# Patient Record
Sex: Female | Born: 1975 | Race: Black or African American | Hispanic: No | Marital: Married | Smoking: Never smoker
Health system: Southern US, Community
[De-identification: ages and names within clinical notes are randomized; demographics above are authoritative.]

---

## 1999-11-25 ENCOUNTER — Other Ambulatory Visit: Admission: RE | Admit: 1999-11-25 | Discharge: 1999-11-25 | Payer: Self-pay | Admitting: Obstetrics and Gynecology

## 2002-06-18 ENCOUNTER — Other Ambulatory Visit: Admission: RE | Admit: 2002-06-18 | Discharge: 2002-06-18 | Payer: Self-pay | Admitting: Obstetrics and Gynecology

## 2003-08-06 ENCOUNTER — Other Ambulatory Visit: Admission: RE | Admit: 2003-08-06 | Discharge: 2003-08-06 | Payer: Self-pay | Admitting: Obstetrics and Gynecology

## 2004-04-28 ENCOUNTER — Emergency Department (HOSPITAL_COMMUNITY): Admission: EM | Admit: 2004-04-28 | Discharge: 2004-04-28 | Payer: Self-pay | Admitting: Emergency Medicine

## 2004-09-11 ENCOUNTER — Inpatient Hospital Stay (HOSPITAL_COMMUNITY): Admission: AD | Admit: 2004-09-11 | Discharge: 2004-09-13 | Payer: Self-pay | Admitting: Obstetrics and Gynecology

## 2005-01-14 ENCOUNTER — Emergency Department (HOSPITAL_COMMUNITY): Admission: EM | Admit: 2005-01-14 | Discharge: 2005-01-14 | Payer: Self-pay | Admitting: *Deleted

## 2006-06-19 ENCOUNTER — Ambulatory Visit (HOSPITAL_COMMUNITY): Admission: RE | Admit: 2006-06-19 | Discharge: 2006-06-19 | Payer: Self-pay | Admitting: Obstetrics and Gynecology

## 2006-07-11 ENCOUNTER — Encounter: Payer: Self-pay | Admitting: Obstetrics and Gynecology

## 2006-07-11 ENCOUNTER — Inpatient Hospital Stay (HOSPITAL_COMMUNITY): Admission: AD | Admit: 2006-07-11 | Discharge: 2006-07-20 | Payer: Self-pay | Admitting: Obstetrics and Gynecology

## 2006-07-13 ENCOUNTER — Encounter: Payer: Self-pay | Admitting: Obstetrics and Gynecology

## 2006-07-16 ENCOUNTER — Encounter: Payer: Self-pay | Admitting: Obstetrics and Gynecology

## 2006-07-17 ENCOUNTER — Ambulatory Visit: Payer: Self-pay | Admitting: Pediatrics

## 2006-07-18 ENCOUNTER — Encounter: Payer: Self-pay | Admitting: Obstetrics and Gynecology

## 2006-07-20 ENCOUNTER — Encounter: Payer: Self-pay | Admitting: Obstetrics and Gynecology

## 2006-07-23 ENCOUNTER — Ambulatory Visit (HOSPITAL_COMMUNITY): Admission: RE | Admit: 2006-07-23 | Discharge: 2006-07-23 | Payer: Self-pay | Admitting: Obstetrics and Gynecology

## 2006-07-26 ENCOUNTER — Ambulatory Visit (HOSPITAL_COMMUNITY): Admission: RE | Admit: 2006-07-26 | Discharge: 2006-07-26 | Payer: Self-pay | Admitting: Obstetrics and Gynecology

## 2006-08-02 ENCOUNTER — Ambulatory Visit (HOSPITAL_COMMUNITY): Admission: RE | Admit: 2006-08-02 | Discharge: 2006-08-02 | Payer: Self-pay | Admitting: Obstetrics and Gynecology

## 2006-08-09 ENCOUNTER — Ambulatory Visit (HOSPITAL_COMMUNITY): Admission: RE | Admit: 2006-08-09 | Discharge: 2006-08-09 | Payer: Self-pay | Admitting: Obstetrics and Gynecology

## 2006-08-23 ENCOUNTER — Ambulatory Visit (HOSPITAL_COMMUNITY): Admission: RE | Admit: 2006-08-23 | Discharge: 2006-08-23 | Payer: Self-pay | Admitting: Obstetrics and Gynecology

## 2006-09-14 ENCOUNTER — Ambulatory Visit (HOSPITAL_COMMUNITY): Admission: RE | Admit: 2006-09-14 | Discharge: 2006-09-14 | Payer: Self-pay | Admitting: Obstetrics and Gynecology

## 2006-10-05 ENCOUNTER — Inpatient Hospital Stay (HOSPITAL_COMMUNITY): Admission: AD | Admit: 2006-10-05 | Discharge: 2006-10-07 | Payer: Self-pay | Admitting: Obstetrics and Gynecology

## 2007-09-17 IMAGING — US US OB FOLLOW-UP EACH ADDL GEST (MODIFY)
1 series · 14 of 28 positions shown · non-contrast
Comparison: none

OBSTETRICAL ULTRASOUND:
 This ultrasound was performed in The [HOSPITAL], and the [facility] report will be stored to [REDACTED] PACS.

[Series 1: us ob follow-up each addl gest (modify) · 14 of 75 slices shown]
[im 3/75]
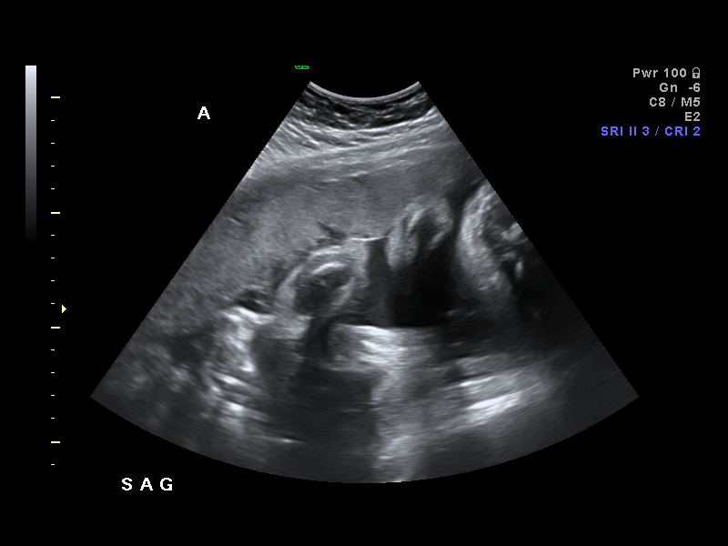
[im 9/75]
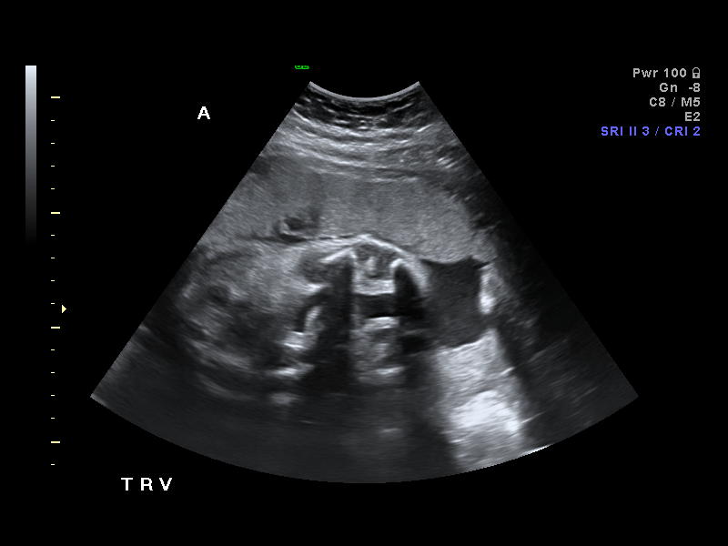
[im 14/75]
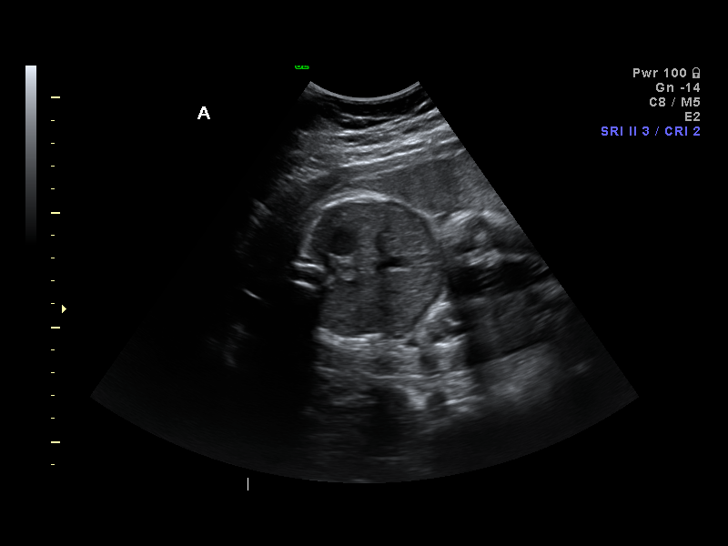
[im 20/75]
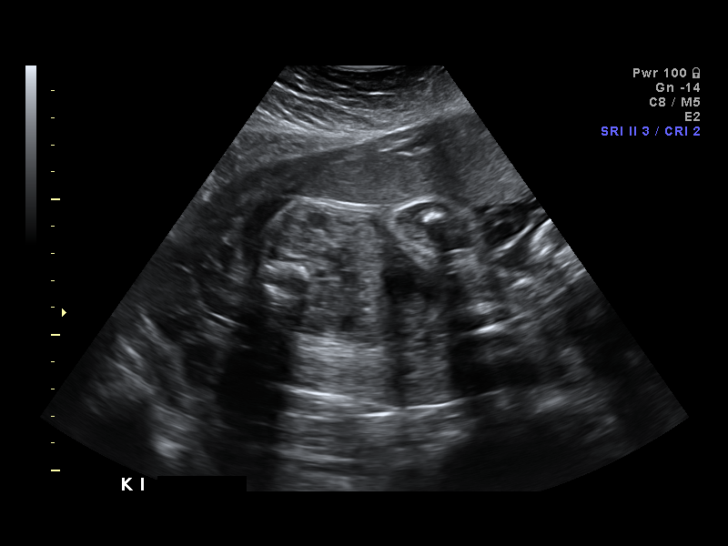
[im 25/75]
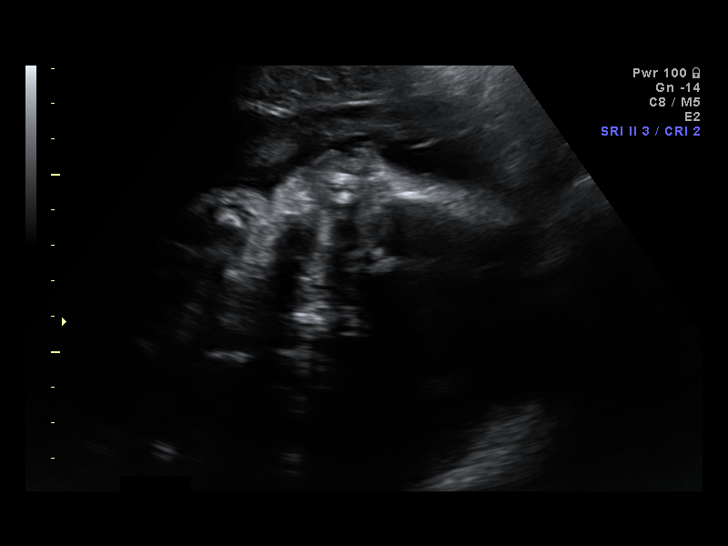
[im 31/75]
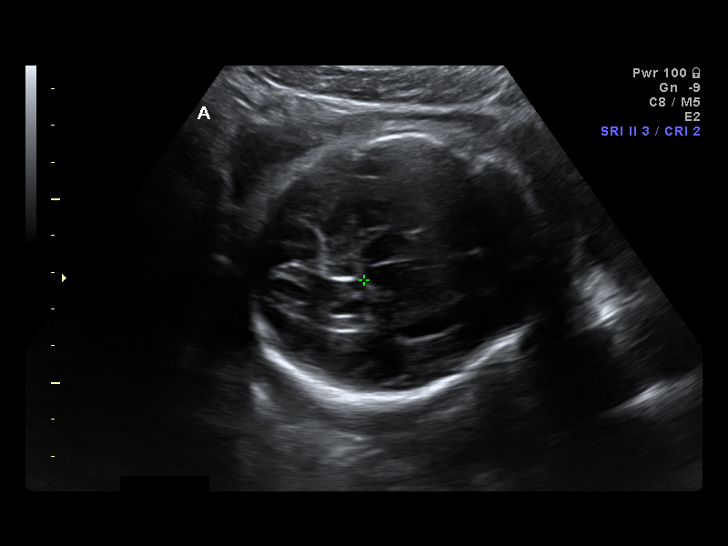
[im 36/75]
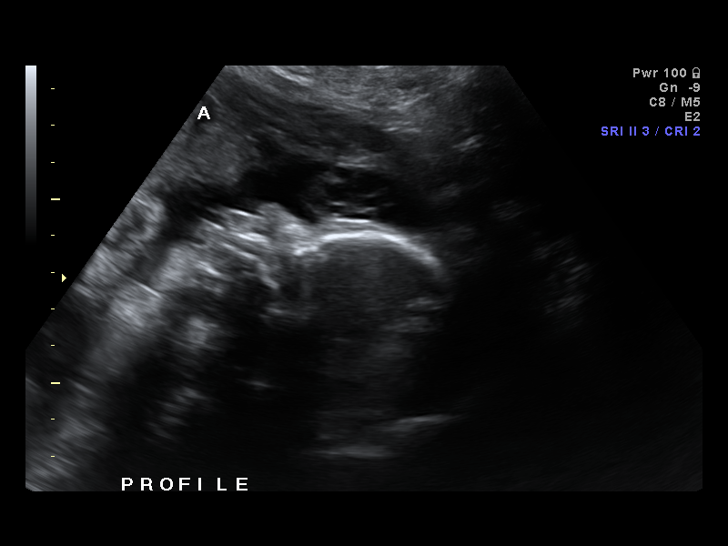
[im 42/75]
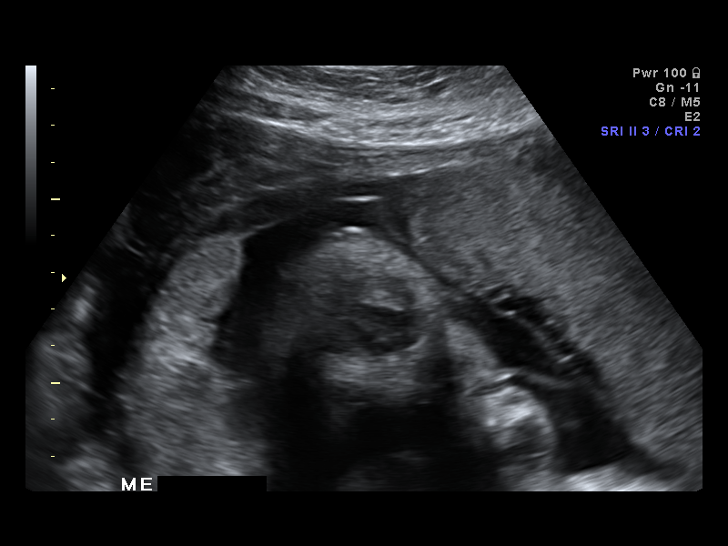
[im 47/75]
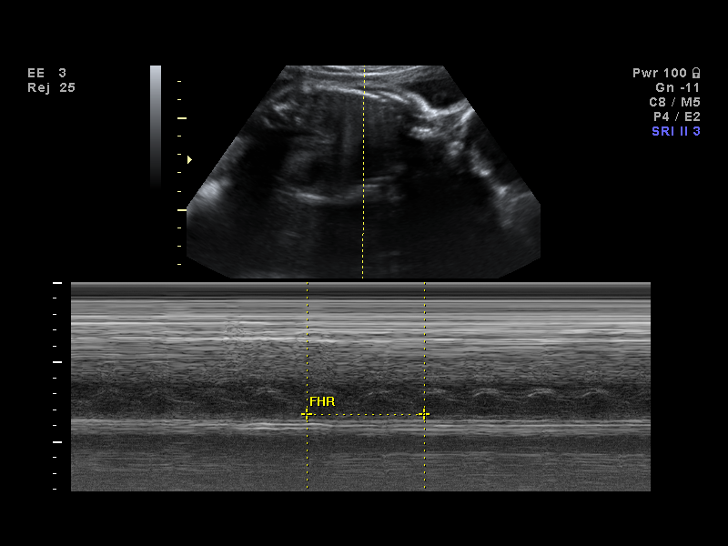
[im 53/75]
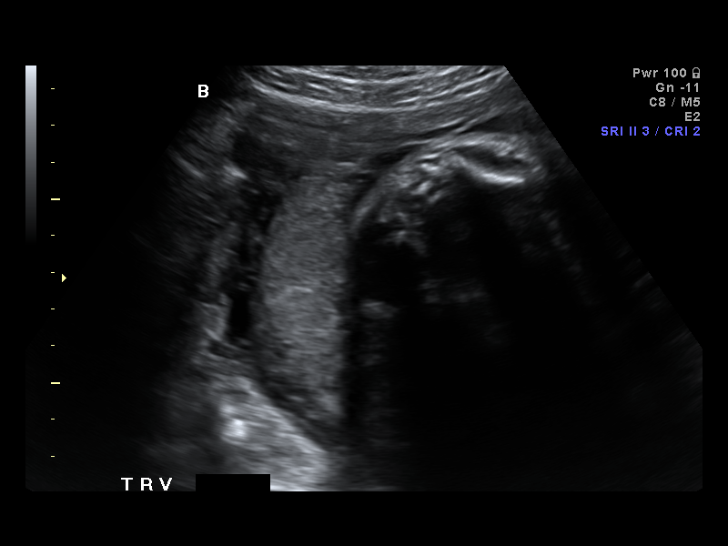
[im 58/75]
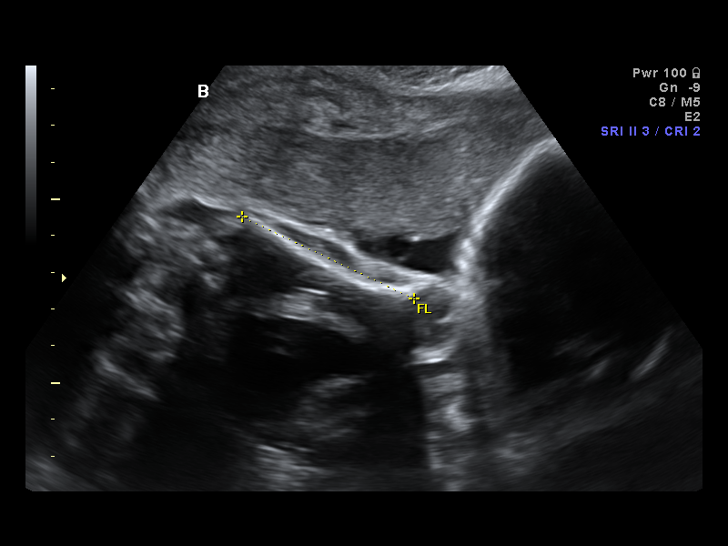
[im 64/75]
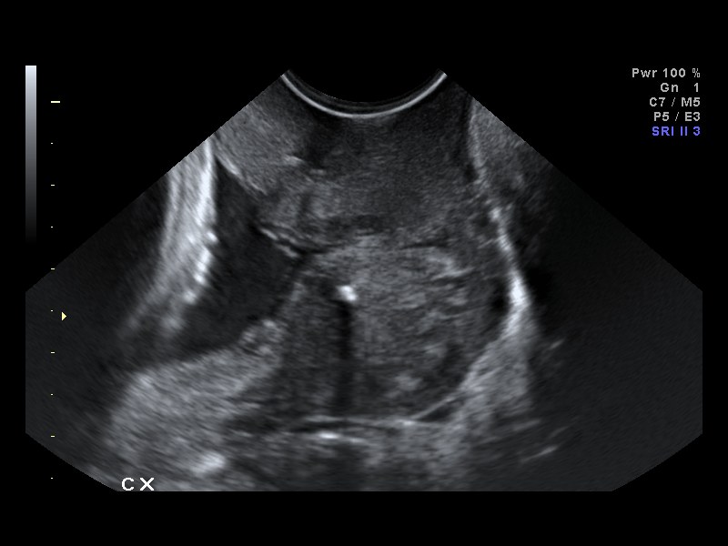
[im 69/75]
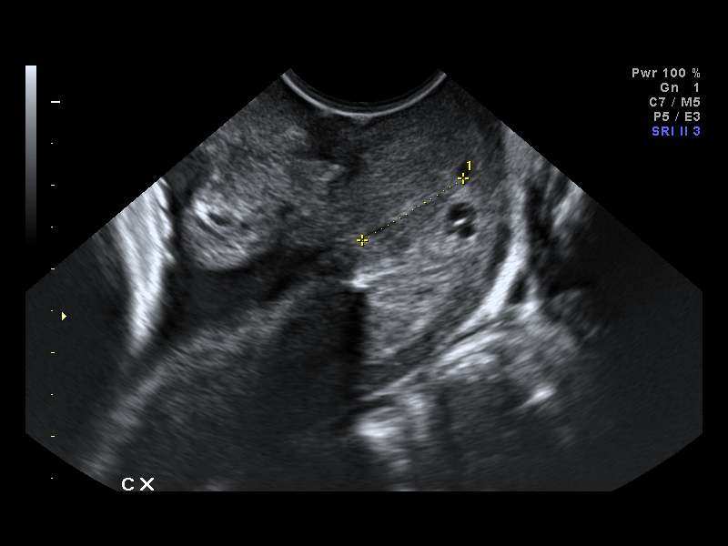
[im 75/75]
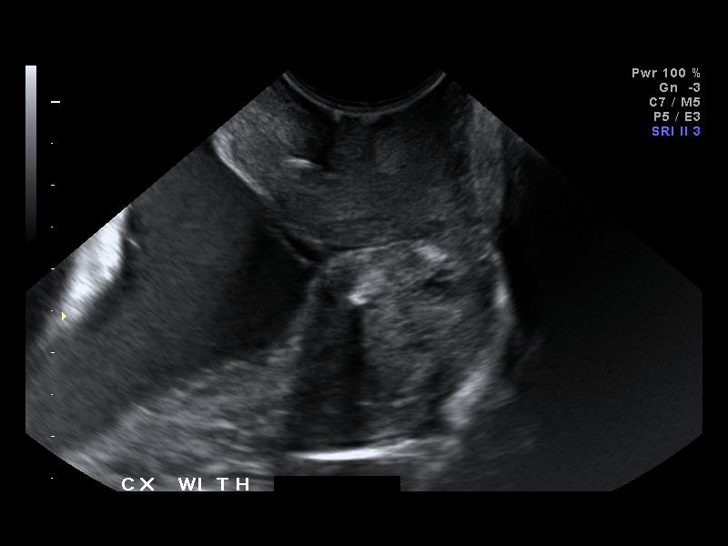

[14 of 28 positions shown; findings below may reference images not displayed]

IMPRESSION: The [facility] report has also been faxed to the ordering physician.

## 2010-08-28 ENCOUNTER — Encounter: Payer: Self-pay | Admitting: Obstetrics and Gynecology

## 2016-07-04 ENCOUNTER — Ambulatory Visit (INDEPENDENT_AMBULATORY_CARE_PROVIDER_SITE_OTHER): Payer: Managed Care, Other (non HMO) | Admitting: Physician Assistant

## 2016-07-04 ENCOUNTER — Ambulatory Visit (INDEPENDENT_AMBULATORY_CARE_PROVIDER_SITE_OTHER): Payer: Managed Care, Other (non HMO)

## 2016-07-04 VITALS — BP 140/80 | HR 70 | Temp 98.4°F | Resp 16 | Ht 67.25 in | Wt 210.0 lb

## 2016-07-04 DIAGNOSIS — M79672 Pain in left foot: Secondary | ICD-10-CM | POA: Diagnosis not present

## 2016-07-04 LAB — URIC ACID: Uric Acid, Serum: 3.1 mg/dL (ref 2.5–7.0)

## 2016-07-04 NOTE — Progress Notes (Signed)
   07/04/2016 11:56 AM   DOB: 04/19/1976 / MRN: [id number]  SUBJECTIVE:  Wanda Hill is a 40 y.o. female presenting for left foot pain about the MTP that started about 4 weeks ago after a fall down some stairs.  She reports she has largely recovered however the will still be some pain with ambulation.  She wants to rule out fracture with an xray.  She has not tried any medication yet. She has no history of gout.   She has No Known Allergies.   She  has no past medical history on file.    She  reports that she has never smoked. She has never used smokeless tobacco. She reports that she does not drink alcohol or use drugs. She  has no sexual activity history on file. The patient  has no past surgical history on file.  Her family history includes Cancer in her paternal grandmother; Gout in her maternal grandmother; Hypertension in her maternal grandmother and mother.  Review of Systems  Constitutional: Negative for chills and fever.  Eyes: Negative for blurred vision.  Respiratory: Negative for cough and shortness of breath.   Cardiovascular: Negative for chest pain.  Gastrointestinal: Negative for abdominal pain and nausea.  Genitourinary: Negative for dysuria, frequency and urgency.  Musculoskeletal: Positive for joint pain. Negative for myalgias.  Skin: Negative for rash.  Neurological: Negative for dizziness, tingling and headaches.  Psychiatric/Behavioral: Negative for depression. The patient is not nervous/anxious.     The problem list and medications were reviewed and updated by myself where necessary and exist elsewhere in the encounter.   OBJECTIVE:  BP 140/80 (BP Location: Right Arm, Patient Position: Sitting, Cuff Size: Normal)   Pulse 70   Temp 98.4 F (36.9 C) (Oral)   Resp 16   Ht 5' 7.25" (1.708 m)   Wt 210 lb (95.3 kg)   LMP 06/02/2016   SpO2 100%   BMI 32.65 kg/m   Physical Exam  Constitutional: She is oriented to person, place, and time. She appears  well-nourished. No distress.  Eyes: EOM are normal. Pupils are equal, round, and reactive to light.  Cardiovascular: Normal rate.   Pulmonary/Chest: Effort normal.  Abdominal: She exhibits no distension.  Musculoskeletal: She exhibits tenderness (left first mtp, negative for swelling, bruising, deformity. ).  Neurological: She is alert and oriented to person, place, and time. No cranial nerve deficit. Gait normal.  Skin: Skin is warm and dry. She is not diaphoretic.  Psychiatric: She has a normal mood and affect.  Vitals reviewed.   No results found for this or any previous visit (from the past 72 hour(s)).  No results found.  ASSESSMENT AND PLAN  [name] was seen today for fall and foot injury.  Diagnoses and all orders for this visit:  Left foot pain: Rads negative.  Placed in post op shoe.  Advised tylenol for now.  If no improvement in one month with refer to podiatry.  -     Uric Acid -     DG Foot 2 Views Left; Future    The patient is advised to call or return to clinic if she does not see an improvement in symptoms, or to seek the care of the closest emergency department if she worsens with the above plan.   Deliah BostonMichael Ilyas Lipsitz, MHS, PA-C [facility] 07/04/2016 11:56 AM

## 2016-07-04 NOTE — Patient Instructions (Addendum)
Tylenol at 1000 mg every eight hours as needed for pain is a safe and effective medication for pain like yours.  It is safe for your stomach, liver and kidneys and does not cause GI bleeding or high blood pressure.     IF you received an x-ray today, you will receive an invoice from [facility]. Please contact [facility] at [phone] with questions or concerns regarding your invoice.   IF you received labwork today, you will receive an invoice from [facility]. Please contact [facility] at [phone] with questions or concerns regarding your invoice.   Our billing staff will not be able to assist you with questions regarding bills from these companies.  You will be contacted with the lab results as soon as they are available. The fastest way to get your results is to activate your My Chart account. Instructions are located on the last page of this paperwork. If you have not heard from us regarding the results in 2 weeks, please contact this office.

## 2016-07-06 ENCOUNTER — Encounter: Payer: Self-pay | Admitting: Physician Assistant

## 2017-01-31 ENCOUNTER — Other Ambulatory Visit: Payer: Self-pay | Admitting: Obstetrics and Gynecology

## 2017-01-31 DIAGNOSIS — N6320 Unspecified lump in the left breast, unspecified quadrant: Secondary | ICD-10-CM

## 2017-02-02 ENCOUNTER — Ambulatory Visit
Admission: RE | Admit: 2017-02-02 | Discharge: 2017-02-02 | Disposition: A | Discharge status: Home / Self Care | Payer: Managed Care, Other (non HMO) | Source: Ambulatory Visit | Attending: Obstetrics and Gynecology | Admitting: Obstetrics and Gynecology

## 2017-02-02 ENCOUNTER — Other Ambulatory Visit: Payer: Self-pay | Admitting: Obstetrics and Gynecology

## 2017-02-02 DIAGNOSIS — N6320 Unspecified lump in the left breast, unspecified quadrant: Secondary | ICD-10-CM

## 2017-08-13 ENCOUNTER — Other Ambulatory Visit: Payer: Self-pay | Admitting: Obstetrics and Gynecology

## 2017-08-13 ENCOUNTER — Ambulatory Visit
Admission: RE | Admit: 2017-08-13 | Discharge: 2017-08-13 | Disposition: A | Discharge status: Home / Self Care | Payer: Managed Care, Other (non HMO) | Source: Ambulatory Visit | Attending: Obstetrics and Gynecology | Admitting: Obstetrics and Gynecology

## 2017-08-13 DIAGNOSIS — N6320 Unspecified lump in the left breast, unspecified quadrant: Secondary | ICD-10-CM

## 2018-02-14 ENCOUNTER — Ambulatory Visit
Admission: RE | Admit: 2018-02-14 | Discharge: 2018-02-14 | Disposition: A | Discharge status: Home / Self Care | Payer: 59 | Source: Ambulatory Visit | Attending: Obstetrics and Gynecology | Admitting: Obstetrics and Gynecology

## 2018-02-14 ENCOUNTER — Other Ambulatory Visit: Payer: Managed Care, Other (non HMO)

## 2018-02-14 ENCOUNTER — Ambulatory Visit
Admission: RE | Admit: 2018-02-14 | Discharge: 2018-02-14 | Disposition: A | Discharge status: Home / Self Care | Payer: Managed Care, Other (non HMO) | Source: Ambulatory Visit | Attending: Obstetrics and Gynecology | Admitting: Obstetrics and Gynecology

## 2018-02-14 DIAGNOSIS — N6320 Unspecified lump in the left breast, unspecified quadrant: Secondary | ICD-10-CM

## 2019-04-17 ENCOUNTER — Ambulatory Visit (INDEPENDENT_AMBULATORY_CARE_PROVIDER_SITE_OTHER): Payer: 59 | Admitting: Pediatrics

## 2019-04-17 ENCOUNTER — Other Ambulatory Visit: Payer: Self-pay

## 2019-04-17 ENCOUNTER — Encounter: Payer: Self-pay | Admitting: Pediatrics

## 2019-04-17 DIAGNOSIS — M25551 Pain in right hip: Secondary | ICD-10-CM | POA: Diagnosis not present

## 2019-04-17 NOTE — Progress Notes (Addendum)
PCP: Patient, No Pcp Per  Subjective:   CC: Patient is a 43 y.o. female here for chronic right hip pain.  HPI: Patient states she has had about 1 year of right hip pain which is gotten worse in the past 6 months.  She denies any known history of injury to initiate this pain.  She states that she saw a sports medicine doctor in January who took x-rays of her hip and saw arthritis.  She did not go back to that doctor as she was not satisfied with that answer.  She states that in the past 6 months her pain has increased secondary to weight gain and decreased activity level.  Patient would normally try to do stretches for her hip which seemed like it helped a little but she was scared to do too much as she did not want to pull the tendon.  Patient does have a limp with walking to decrease pain.  She notices that swinging her leg out will help decrease the pain while walking.  Patient is not very active and stays home with her children during the day.  Patient denies any radiating pain down her leg, weakness, falls with leg giving out, numbness or tingling.  ROS: See HPI above.  I have personally reviewed pertinent past medical history, surgical, family, and social history as appropriate.      Objective:  BP 110/80   Ht 5\' 8"  (1.727 m)   Wt 225 lb (102.1 kg)   BMI 34.21 kg/m   Physical Exam: Gen: NAD, comfortable in exam room Right hip: Observation: Patient able to sit without apparent distress.  Apparent right leg approximately 1 inch longer than left by observing medial malleoli bilaterally Gait: Patient walks with algesic gait favoring her left leg.  Patient has valgus deformity of right leg while walking with out toeing and swinging her leg outwards while walking. Palpation: Patient denies tenderness to palpation of her greater tuberosity, groin.  No masses or swelling are identified ROM: Patient has painless flexion and extension of bilateral knees and ankle.  Left hip able to abduct extend  and flex without pain.  Right hip able to abduct extend and flex but does have pain with passive range of motion. Strength: Patient has 5 out of 5 strength with hip flexors, knees flexion and extension Sensation: Symmetrical Special tests: Positive pain in groin with Corky Sox and logroll  Back: Observation: Negative for asymmetry with forward bending to observe for scoliosis Palpation: Negative for tenderness along lumbar and sacral spine.  Neuro: Bilateral lower extremities have 5 out of 5 strength.  Sensation intact bilaterally.  Patellar tendon reflexes minimal bilaterally and symmetrical.   Assessment & Plan:  Hip pain, right Chronic right hip pain with algesic gait likely secondary to arthritis worsening -Recommend stretching and exercises as tolerated to strengthen peri-hip musculature -Recommend reconsulting with [facility] for consultation for hip replacement -Offered joint injection but patient declined and adamantly refuses this option -Take OTC anti-inflammatory pain medications as needed    I independently examined pertinent imaging in relation to cc.  Doristine Mango, [facility] PGY-2   I was the preceptor for this visit and available for immediate consultation to both the resident and the sports medicine fellow Shellia Cleverly, DO

## 2019-04-17 NOTE — Patient Instructions (Signed)
[facility] [address] [place]  [phone] Tues 9.29.20 at 14am

## 2019-04-17 NOTE — Assessment & Plan Note (Signed)
Chronic right hip pain with algesic gait likely secondary to arthritis worsening -Recommend stretching and exercises as tolerated to strengthen peri-hip musculature -Recommend reconsulting with [facility] for consultation for hip replacement -Offered joint injection but patient declined and adamantly refuses this option -Take OTC anti-inflammatory pain medications as needed

## 2019-04-18 ENCOUNTER — Encounter: Payer: Self-pay | Admitting: Pediatrics
